# Patient Record
Sex: Female | Born: 1960
Health system: Southern US, Community
[De-identification: ages and names within clinical notes are randomized; demographics above are authoritative.]

## PROBLEM LIST (undated history)

## (undated) DIAGNOSIS — I10 Essential (primary) hypertension: Secondary | ICD-10-CM

## (undated) DIAGNOSIS — E079 Disorder of thyroid, unspecified: Secondary | ICD-10-CM

## (undated) DIAGNOSIS — E7800 Pure hypercholesterolemia, unspecified: Secondary | ICD-10-CM

## (undated) DIAGNOSIS — H409 Unspecified glaucoma: Secondary | ICD-10-CM

---

## 2007-08-16 ENCOUNTER — Observation Stay (HOSPITAL_COMMUNITY): Admission: EM | Admit: 2007-08-16 | Discharge: 2007-08-17 | Payer: Self-pay | Admitting: Emergency Medicine

## 2011-01-06 NOTE — Discharge Summary (Signed)
NAME:  Nicole Greer, Nicole Greer                    ACCOUNT NO.:  [id number]   MEDICAL RECORD NO.:  [id number]          PATIENT TYPE:  INP   LOCATION:  3730                         FACILITY:  [facility]   PHYSICIAN:  Mohan N. Sharyn Lull, M.D. DATE OF BIRTH:  February 12, 1961   DATE OF ADMISSION:  08/16/2007  DATE OF DISCHARGE:  08/17/2007                               DISCHARGE SUMMARY   ADMITTING DIAGNOSIS:  1. Chest pain rule out myocardial infarction.  2. Anxiety disorder.  3. Elevated blood pressure, rule out hypertension.  4. Positive family history of coronary artery disease.   FINAL DIAGNOSIS:  1. Status post chest pain, myocardial infarction ruled.  2. New onset hypertension.  3. Elevated blood sugar, rule out diabetes mellitus.  4. Hypercholesteremia.  5. Morbid obesity.  6. Anxiety disorder.   DISCHARGE MEDICATIONS:  1. Enteric-coated aspirin 81 mg 1 tablet daily.  2. Toprol XL 25 mg tablet daily.  3. Protonix 40 mg 1 tablet daily.  4. Nitrostat 0.4 mg sublingual use as directed.  5. Xanax 0.25 mg 1 tablet twice daily as needed.   DIET:  Low salt, low cholesterol.  The patient has been advised to avoid  sweets.   ACTIVITY:  As tolerated.   Fasting blood sugar and hemoglobin A1c in 2 weeks.   CONDITION AT DISCHARGE:  Stable.   BRIEF HISTORY:  Ms. Walpole is a 50 year old white female with past medical  history significant anxiety disorder, positive family history of  coronary artery disease.  She came to the ER via EMS complaining of  retrosternal chest tightness pressure, grade 4/10 radiating to the left  shoulder associated with palpitation while at work.  She states pain not  related to food, breathing or movement.  Denies any nausea, vomiting  diaphoresis.  Denies shortness of breath.  Denies history of exertional  chest pain.  The patient received two baby aspirin with relief of chest  pain.  Denies such episodes of chest pain in the past.  She states she  is under a lot of stress lately  at work. Denies any cough, fever or  chills.  Denies abdominal pain.   PAST MEDICAL HISTORY:  As above.   PAST SURGICAL HISTORY:  None.   ALLERGIES:  None.   MEDICATION:  Vitamins.   SOCIAL HISTORY:  She is single, lives with partner. No history of  smoking.  Drinks occasionally socially.  Works for [employer].   FAMILY HISTORY:  Father is alive, he has coronary artery disease.  He  had PTCA stenting to LAD in the past.  He is hypertensive.  He has  hypercholesteremia and paroxysmal A fib. Mother is alive in good health,  four brothers in good health.   PHYSICAL EXAMINATION:  She is alert and oriented x3 in no acute  distress.  Blood pressure was 151/88, pulse was 97 regular.  Conjunctivae was pink.  NECK:  Supple, no JVD, no bruit.  LUNGS:  Clear to auscultation without rhonchi or rales.  CARDIOVASCULAR:  S1, S2 was normal.  There was a soft systolic murmur.  There was no S3 gallop or S4 gallop.  ABDOMEN:  Soft, obese, bowel sounds were present, nontender.  EXTREMITIES:  There is no clubbing, cyanosis or edema.   LABORATORY DATA:  EKG showed normal sinus rhythm with no acute ischemic  changes.  Her two sets of CPK-MB and troponin I were normal.  Hemoglobin  was 14.8, hematocrit 43.6, white count of 9.1. Potassium was 4.2,  glucose was slightly elevated 117, BUN 8, creatinine 0.87.  CRP was 0.6.  Cholesterol was 198, LDL 117, HDL 63.   BRIEF HOSPITAL COURSE:  The patient was admitted to the telemetry unit,  MI was ruled out by serial enzymes and EKG. The patient did not have any  episodes of chest pain during the hospital stay. The patient underwent  stress Myoview today, exercised for 7.30 minutes on Bruce protocol,  complained of fatigue and shortness of breath, no chest pain was  reported. Peak blood pressure was 185/66, peak heart rate was 173 which  was 99% of maximum predicted heart rate. EKG showed no acute ischemic  changes during exercise or in  recovery phase. Occasional PVCs were noted  at the peak of exercise.  The patient's Myoview scan is still pending.  The patient will be discharged home this afternoon if her Myoview scan  is normal.      Mohan N. Sharyn Lull, M.D.  Electronically Signed     [facility]/MEDQ  D:  08/17/2007  T:  08/17/2007  Job:  [id number]

## 2011-05-29 LAB — COMPREHENSIVE METABOLIC PANEL
ALT: 26
AST: 27
Albumin: 4
CO2: 26
Calcium: 9.5
Chloride: 105
Potassium: 4.2

## 2011-05-29 LAB — CARDIAC PANEL(CRET KIN+CKTOT+MB+TROPI)
CK, MB: 0.9
Relative Index: INVALID
Total CK: 43

## 2011-05-29 LAB — CBC
HCT: 43.6
Hemoglobin: 14.8
MCV: 85.7
Platelets: 350
WBC: 9.1

## 2011-05-29 LAB — DIFFERENTIAL
Eosinophils Absolute: 0 — ABNORMAL LOW
Lymphocytes Relative: 18
Lymphs Abs: 1.6
Monocytes Relative: 5
Neutro Abs: 7

## 2011-05-29 LAB — LIPID PANEL
Cholesterol: 198
HDL: 63
VLDL: 18

## 2011-05-29 LAB — CK TOTAL AND CKMB (NOT AT ARMC)
CK, MB: 1.3
Total CK: 59

## 2011-05-29 LAB — PROTIME-INR: INR: 0.9

## 2011-05-29 LAB — APTT: aPTT: 30

## 2011-05-29 LAB — C-REACTIVE PROTEIN: CRP: 0.6 — ABNORMAL HIGH (ref <0.6)

## 2011-05-29 LAB — HEPARIN LEVEL (UNFRACTIONATED): Heparin Unfractionated: 0.92 — ABNORMAL HIGH

## 2013-08-18 HISTORY — PX: ABDOMINAL HYSTERECTOMY: SHX81

## 2016-03-31 DIAGNOSIS — E669 Obesity, unspecified: Secondary | ICD-10-CM | POA: Diagnosis not present

## 2016-03-31 DIAGNOSIS — I10 Essential (primary) hypertension: Secondary | ICD-10-CM | POA: Diagnosis not present

## 2016-03-31 DIAGNOSIS — E785 Hyperlipidemia, unspecified: Secondary | ICD-10-CM | POA: Diagnosis not present

## 2016-06-03 DIAGNOSIS — L0231 Cutaneous abscess of buttock: Secondary | ICD-10-CM | POA: Diagnosis not present

## 2016-06-08 DIAGNOSIS — L0231 Cutaneous abscess of buttock: Secondary | ICD-10-CM | POA: Diagnosis not present

## 2016-06-14 DIAGNOSIS — L0231 Cutaneous abscess of buttock: Secondary | ICD-10-CM | POA: Diagnosis not present

## 2016-06-29 DIAGNOSIS — C569 Malignant neoplasm of unspecified ovary: Secondary | ICD-10-CM | POA: Diagnosis not present

## 2016-06-29 DIAGNOSIS — Z6837 Body mass index (BMI) 37.0-37.9, adult: Secondary | ICD-10-CM | POA: Diagnosis not present

## 2016-06-29 DIAGNOSIS — D500 Iron deficiency anemia secondary to blood loss (chronic): Secondary | ICD-10-CM | POA: Diagnosis not present

## 2016-06-29 DIAGNOSIS — T451X5A Adverse effect of antineoplastic and immunosuppressive drugs, initial encounter: Secondary | ICD-10-CM | POA: Diagnosis not present

## 2016-06-29 DIAGNOSIS — C541 Malignant neoplasm of endometrium: Secondary | ICD-10-CM | POA: Diagnosis not present

## 2016-06-29 DIAGNOSIS — G620 Drug-induced polyneuropathy: Secondary | ICD-10-CM | POA: Diagnosis not present

## 2016-07-06 DIAGNOSIS — J8410 Pulmonary fibrosis, unspecified: Secondary | ICD-10-CM | POA: Diagnosis not present

## 2016-07-06 DIAGNOSIS — Z8542 Personal history of malignant neoplasm of other parts of uterus: Secondary | ICD-10-CM | POA: Diagnosis not present

## 2016-07-06 DIAGNOSIS — C541 Malignant neoplasm of endometrium: Secondary | ICD-10-CM | POA: Diagnosis not present

## 2016-07-06 DIAGNOSIS — Z8543 Personal history of malignant neoplasm of ovary: Secondary | ICD-10-CM | POA: Diagnosis not present

## 2016-07-06 DIAGNOSIS — M47814 Spondylosis without myelopathy or radiculopathy, thoracic region: Secondary | ICD-10-CM | POA: Diagnosis not present

## 2016-07-06 DIAGNOSIS — R911 Solitary pulmonary nodule: Secondary | ICD-10-CM | POA: Diagnosis not present

## 2016-07-06 DIAGNOSIS — L0231 Cutaneous abscess of buttock: Secondary | ICD-10-CM | POA: Diagnosis not present

## 2016-07-09 DIAGNOSIS — K611 Rectal abscess: Secondary | ICD-10-CM | POA: Diagnosis not present

## 2016-07-09 DIAGNOSIS — Z6836 Body mass index (BMI) 36.0-36.9, adult: Secondary | ICD-10-CM | POA: Diagnosis not present

## 2016-07-10 DIAGNOSIS — E785 Hyperlipidemia, unspecified: Secondary | ICD-10-CM | POA: Diagnosis not present

## 2016-07-10 DIAGNOSIS — Z01810 Encounter for preprocedural cardiovascular examination: Secondary | ICD-10-CM | POA: Diagnosis not present

## 2016-07-10 DIAGNOSIS — Z923 Personal history of irradiation: Secondary | ICD-10-CM | POA: Diagnosis not present

## 2016-07-10 DIAGNOSIS — Z9889 Other specified postprocedural states: Secondary | ICD-10-CM | POA: Diagnosis not present

## 2016-07-10 DIAGNOSIS — Z9221 Personal history of antineoplastic chemotherapy: Secondary | ICD-10-CM | POA: Diagnosis not present

## 2016-07-10 DIAGNOSIS — D649 Anemia, unspecified: Secondary | ICD-10-CM | POA: Diagnosis not present

## 2016-07-10 DIAGNOSIS — K644 Residual hemorrhoidal skin tags: Secondary | ICD-10-CM | POA: Diagnosis not present

## 2016-07-10 DIAGNOSIS — Z7982 Long term (current) use of aspirin: Secondary | ICD-10-CM | POA: Diagnosis not present

## 2016-07-10 DIAGNOSIS — K611 Rectal abscess: Secondary | ICD-10-CM | POA: Diagnosis not present

## 2016-07-10 DIAGNOSIS — Z8543 Personal history of malignant neoplasm of ovary: Secondary | ICD-10-CM | POA: Diagnosis not present

## 2016-07-10 DIAGNOSIS — Z90710 Acquired absence of both cervix and uterus: Secondary | ICD-10-CM | POA: Diagnosis not present

## 2016-07-10 DIAGNOSIS — Z79899 Other long term (current) drug therapy: Secondary | ICD-10-CM | POA: Diagnosis not present

## 2016-07-10 DIAGNOSIS — I10 Essential (primary) hypertension: Secondary | ICD-10-CM | POA: Diagnosis not present

## 2016-07-28 DIAGNOSIS — E669 Obesity, unspecified: Secondary | ICD-10-CM | POA: Diagnosis not present

## 2016-07-28 DIAGNOSIS — I10 Essential (primary) hypertension: Secondary | ICD-10-CM | POA: Diagnosis not present

## 2016-07-28 DIAGNOSIS — E785 Hyperlipidemia, unspecified: Secondary | ICD-10-CM | POA: Diagnosis not present

## 2016-09-01 DIAGNOSIS — K611 Rectal abscess: Secondary | ICD-10-CM | POA: Diagnosis not present

## 2016-09-01 DIAGNOSIS — Z09 Encounter for follow-up examination after completed treatment for conditions other than malignant neoplasm: Secondary | ICD-10-CM | POA: Diagnosis not present

## 2016-09-15 DIAGNOSIS — K611 Rectal abscess: Secondary | ICD-10-CM | POA: Diagnosis not present

## 2016-09-15 DIAGNOSIS — Z09 Encounter for follow-up examination after completed treatment for conditions other than malignant neoplasm: Secondary | ICD-10-CM | POA: Diagnosis not present

## 2016-10-06 DIAGNOSIS — Z6836 Body mass index (BMI) 36.0-36.9, adult: Secondary | ICD-10-CM | POA: Diagnosis not present

## 2016-10-06 DIAGNOSIS — K611 Rectal abscess: Secondary | ICD-10-CM | POA: Diagnosis not present

## 2016-10-20 DIAGNOSIS — K611 Rectal abscess: Secondary | ICD-10-CM | POA: Diagnosis not present

## 2016-11-03 DIAGNOSIS — Z6836 Body mass index (BMI) 36.0-36.9, adult: Secondary | ICD-10-CM | POA: Diagnosis not present

## 2016-11-03 DIAGNOSIS — K611 Rectal abscess: Secondary | ICD-10-CM | POA: Diagnosis not present

## 2016-11-03 DIAGNOSIS — Z9889 Other specified postprocedural states: Secondary | ICD-10-CM | POA: Diagnosis not present

## 2016-11-04 DIAGNOSIS — Z90710 Acquired absence of both cervix and uterus: Secondary | ICD-10-CM | POA: Diagnosis not present

## 2016-11-04 DIAGNOSIS — Z833 Family history of diabetes mellitus: Secondary | ICD-10-CM | POA: Diagnosis not present

## 2016-11-04 DIAGNOSIS — Z08 Encounter for follow-up examination after completed treatment for malignant neoplasm: Secondary | ICD-10-CM | POA: Diagnosis not present

## 2016-11-04 DIAGNOSIS — Z8543 Personal history of malignant neoplasm of ovary: Secondary | ICD-10-CM | POA: Diagnosis not present

## 2016-11-04 DIAGNOSIS — Z6836 Body mass index (BMI) 36.0-36.9, adult: Secondary | ICD-10-CM | POA: Diagnosis not present

## 2016-11-04 DIAGNOSIS — C541 Malignant neoplasm of endometrium: Secondary | ICD-10-CM | POA: Diagnosis not present

## 2016-11-04 DIAGNOSIS — Z801 Family history of malignant neoplasm of trachea, bronchus and lung: Secondary | ICD-10-CM | POA: Diagnosis not present

## 2016-11-04 DIAGNOSIS — G620 Drug-induced polyneuropathy: Secondary | ICD-10-CM | POA: Diagnosis not present

## 2016-11-04 DIAGNOSIS — I10 Essential (primary) hypertension: Secondary | ICD-10-CM | POA: Diagnosis not present

## 2016-11-04 DIAGNOSIS — C569 Malignant neoplasm of unspecified ovary: Secondary | ICD-10-CM | POA: Diagnosis not present

## 2016-11-04 DIAGNOSIS — T451X5S Adverse effect of antineoplastic and immunosuppressive drugs, sequela: Secondary | ICD-10-CM | POA: Diagnosis not present

## 2016-11-04 DIAGNOSIS — Z90722 Acquired absence of ovaries, bilateral: Secondary | ICD-10-CM | POA: Diagnosis not present

## 2016-11-04 DIAGNOSIS — D500 Iron deficiency anemia secondary to blood loss (chronic): Secondary | ICD-10-CM | POA: Diagnosis not present

## 2016-11-04 DIAGNOSIS — E7800 Pure hypercholesterolemia, unspecified: Secondary | ICD-10-CM | POA: Diagnosis not present

## 2016-11-04 DIAGNOSIS — T451X5A Adverse effect of antineoplastic and immunosuppressive drugs, initial encounter: Secondary | ICD-10-CM | POA: Diagnosis not present

## 2016-11-04 DIAGNOSIS — Z91048 Other nonmedicinal substance allergy status: Secondary | ICD-10-CM | POA: Diagnosis not present

## 2016-11-04 DIAGNOSIS — G43909 Migraine, unspecified, not intractable, without status migrainosus: Secondary | ICD-10-CM | POA: Diagnosis not present

## 2016-11-04 DIAGNOSIS — F329 Major depressive disorder, single episode, unspecified: Secondary | ICD-10-CM | POA: Diagnosis not present

## 2016-11-04 DIAGNOSIS — Z9221 Personal history of antineoplastic chemotherapy: Secondary | ICD-10-CM | POA: Diagnosis not present

## 2016-11-04 DIAGNOSIS — Z8249 Family history of ischemic heart disease and other diseases of the circulatory system: Secondary | ICD-10-CM | POA: Diagnosis not present

## 2016-11-04 DIAGNOSIS — Z7982 Long term (current) use of aspirin: Secondary | ICD-10-CM | POA: Diagnosis not present

## 2016-11-04 DIAGNOSIS — Z8542 Personal history of malignant neoplasm of other parts of uterus: Secondary | ICD-10-CM | POA: Diagnosis not present

## 2016-11-04 DIAGNOSIS — Z79899 Other long term (current) drug therapy: Secondary | ICD-10-CM | POA: Diagnosis not present

## 2016-11-04 DIAGNOSIS — Z803 Family history of malignant neoplasm of breast: Secondary | ICD-10-CM | POA: Diagnosis not present

## 2016-11-16 DIAGNOSIS — E785 Hyperlipidemia, unspecified: Secondary | ICD-10-CM | POA: Diagnosis not present

## 2016-11-16 DIAGNOSIS — I10 Essential (primary) hypertension: Secondary | ICD-10-CM | POA: Diagnosis not present

## 2016-12-01 DIAGNOSIS — K611 Rectal abscess: Secondary | ICD-10-CM | POA: Diagnosis not present

## 2016-12-01 DIAGNOSIS — Z6836 Body mass index (BMI) 36.0-36.9, adult: Secondary | ICD-10-CM | POA: Diagnosis not present

## 2016-12-01 DIAGNOSIS — Z9889 Other specified postprocedural states: Secondary | ICD-10-CM | POA: Diagnosis not present

## 2016-12-03 DIAGNOSIS — R002 Palpitations: Secondary | ICD-10-CM | POA: Diagnosis not present

## 2016-12-03 DIAGNOSIS — E785 Hyperlipidemia, unspecified: Secondary | ICD-10-CM | POA: Diagnosis not present

## 2016-12-03 DIAGNOSIS — I10 Essential (primary) hypertension: Secondary | ICD-10-CM | POA: Diagnosis not present

## 2016-12-08 DIAGNOSIS — C541 Malignant neoplasm of endometrium: Secondary | ICD-10-CM | POA: Diagnosis not present

## 2016-12-11 DIAGNOSIS — Z1231 Encounter for screening mammogram for malignant neoplasm of breast: Secondary | ICD-10-CM | POA: Diagnosis not present

## 2016-12-16 DIAGNOSIS — N6489 Other specified disorders of breast: Secondary | ICD-10-CM | POA: Diagnosis not present

## 2016-12-16 DIAGNOSIS — R928 Other abnormal and inconclusive findings on diagnostic imaging of breast: Secondary | ICD-10-CM | POA: Diagnosis not present

## 2016-12-31 DIAGNOSIS — Z6837 Body mass index (BMI) 37.0-37.9, adult: Secondary | ICD-10-CM | POA: Diagnosis not present

## 2016-12-31 DIAGNOSIS — K611 Rectal abscess: Secondary | ICD-10-CM | POA: Diagnosis not present

## 2016-12-31 DIAGNOSIS — Z9889 Other specified postprocedural states: Secondary | ICD-10-CM | POA: Diagnosis not present

## 2017-03-04 DIAGNOSIS — Z8543 Personal history of malignant neoplasm of ovary: Secondary | ICD-10-CM | POA: Diagnosis not present

## 2017-03-04 DIAGNOSIS — C569 Malignant neoplasm of unspecified ovary: Secondary | ICD-10-CM | POA: Diagnosis not present

## 2017-03-04 DIAGNOSIS — R935 Abnormal findings on diagnostic imaging of other abdominal regions, including retroperitoneum: Secondary | ICD-10-CM | POA: Diagnosis not present

## 2017-03-04 DIAGNOSIS — R971 Elevated cancer antigen 125 [CA 125]: Secondary | ICD-10-CM | POA: Diagnosis not present

## 2017-03-04 DIAGNOSIS — R600 Localized edema: Secondary | ICD-10-CM | POA: Diagnosis not present

## 2017-03-08 DIAGNOSIS — T451X5S Adverse effect of antineoplastic and immunosuppressive drugs, sequela: Secondary | ICD-10-CM | POA: Diagnosis not present

## 2017-03-08 DIAGNOSIS — G620 Drug-induced polyneuropathy: Secondary | ICD-10-CM | POA: Diagnosis not present

## 2017-03-08 DIAGNOSIS — I10 Essential (primary) hypertension: Secondary | ICD-10-CM | POA: Diagnosis not present

## 2017-03-08 DIAGNOSIS — T451X5A Adverse effect of antineoplastic and immunosuppressive drugs, initial encounter: Secondary | ICD-10-CM | POA: Diagnosis not present

## 2017-03-08 DIAGNOSIS — Z6836 Body mass index (BMI) 36.0-36.9, adult: Secondary | ICD-10-CM | POA: Diagnosis not present

## 2017-03-08 DIAGNOSIS — C541 Malignant neoplasm of endometrium: Secondary | ICD-10-CM | POA: Diagnosis not present

## 2017-03-08 DIAGNOSIS — G43909 Migraine, unspecified, not intractable, without status migrainosus: Secondary | ICD-10-CM | POA: Diagnosis not present

## 2017-03-08 DIAGNOSIS — D509 Iron deficiency anemia, unspecified: Secondary | ICD-10-CM | POA: Diagnosis not present

## 2017-03-08 DIAGNOSIS — C569 Malignant neoplasm of unspecified ovary: Secondary | ICD-10-CM | POA: Diagnosis not present

## 2017-03-08 DIAGNOSIS — E7800 Pure hypercholesterolemia, unspecified: Secondary | ICD-10-CM | POA: Diagnosis not present

## 2017-03-08 DIAGNOSIS — F329 Major depressive disorder, single episode, unspecified: Secondary | ICD-10-CM | POA: Diagnosis not present

## 2017-03-08 DIAGNOSIS — Z8542 Personal history of malignant neoplasm of other parts of uterus: Secondary | ICD-10-CM | POA: Diagnosis not present

## 2017-03-08 DIAGNOSIS — Z8543 Personal history of malignant neoplasm of ovary: Secondary | ICD-10-CM | POA: Diagnosis not present

## 2017-03-08 DIAGNOSIS — Z9221 Personal history of antineoplastic chemotherapy: Secondary | ICD-10-CM | POA: Diagnosis not present

## 2017-03-08 DIAGNOSIS — Z9049 Acquired absence of other specified parts of digestive tract: Secondary | ICD-10-CM | POA: Diagnosis not present

## 2017-03-08 DIAGNOSIS — Z08 Encounter for follow-up examination after completed treatment for malignant neoplasm: Secondary | ICD-10-CM | POA: Diagnosis not present

## 2017-03-24 DIAGNOSIS — K603 Anal fistula: Secondary | ICD-10-CM | POA: Diagnosis not present

## 2017-03-24 DIAGNOSIS — R198 Other specified symptoms and signs involving the digestive system and abdomen: Secondary | ICD-10-CM | POA: Diagnosis not present

## 2017-04-28 DIAGNOSIS — D120 Benign neoplasm of cecum: Secondary | ICD-10-CM | POA: Diagnosis not present

## 2017-04-28 DIAGNOSIS — K603 Anal fistula: Secondary | ICD-10-CM | POA: Diagnosis not present

## 2017-04-28 DIAGNOSIS — Z8542 Personal history of malignant neoplasm of other parts of uterus: Secondary | ICD-10-CM | POA: Diagnosis not present

## 2017-04-28 DIAGNOSIS — Z90710 Acquired absence of both cervix and uterus: Secondary | ICD-10-CM | POA: Diagnosis not present

## 2017-04-28 DIAGNOSIS — I10 Essential (primary) hypertension: Secondary | ICD-10-CM | POA: Diagnosis not present

## 2017-04-28 DIAGNOSIS — K627 Radiation proctitis: Secondary | ICD-10-CM | POA: Diagnosis not present

## 2017-04-28 DIAGNOSIS — K6289 Other specified diseases of anus and rectum: Secondary | ICD-10-CM | POA: Diagnosis not present

## 2017-04-28 DIAGNOSIS — Z923 Personal history of irradiation: Secondary | ICD-10-CM | POA: Diagnosis not present

## 2017-11-15 DIAGNOSIS — Z08 Encounter for follow-up examination after completed treatment for malignant neoplasm: Secondary | ICD-10-CM | POA: Diagnosis not present

## 2017-11-15 DIAGNOSIS — C541 Malignant neoplasm of endometrium: Secondary | ICD-10-CM | POA: Diagnosis not present

## 2017-11-15 DIAGNOSIS — C561 Malignant neoplasm of right ovary: Secondary | ICD-10-CM | POA: Diagnosis not present

## 2017-11-15 DIAGNOSIS — Z8543 Personal history of malignant neoplasm of ovary: Secondary | ICD-10-CM | POA: Diagnosis not present

## 2017-11-15 DIAGNOSIS — C562 Malignant neoplasm of left ovary: Secondary | ICD-10-CM | POA: Diagnosis not present

## 2017-11-15 DIAGNOSIS — Z90710 Acquired absence of both cervix and uterus: Secondary | ICD-10-CM | POA: Diagnosis not present

## 2017-11-15 DIAGNOSIS — Z8542 Personal history of malignant neoplasm of other parts of uterus: Secondary | ICD-10-CM | POA: Diagnosis not present

## 2017-11-29 DIAGNOSIS — E785 Hyperlipidemia, unspecified: Secondary | ICD-10-CM | POA: Diagnosis not present

## 2017-11-29 DIAGNOSIS — R7309 Other abnormal glucose: Secondary | ICD-10-CM | POA: Diagnosis not present

## 2017-11-29 DIAGNOSIS — I10 Essential (primary) hypertension: Secondary | ICD-10-CM | POA: Diagnosis not present

## 2017-11-29 DIAGNOSIS — Z8543 Personal history of malignant neoplasm of ovary: Secondary | ICD-10-CM | POA: Diagnosis not present

## 2017-11-30 DIAGNOSIS — R7303 Prediabetes: Secondary | ICD-10-CM | POA: Diagnosis not present

## 2017-11-30 DIAGNOSIS — E785 Hyperlipidemia, unspecified: Secondary | ICD-10-CM | POA: Diagnosis not present

## 2017-11-30 DIAGNOSIS — I10 Essential (primary) hypertension: Secondary | ICD-10-CM | POA: Diagnosis not present

## 2018-01-14 DIAGNOSIS — M9901 Segmental and somatic dysfunction of cervical region: Secondary | ICD-10-CM | POA: Diagnosis not present

## 2018-01-14 DIAGNOSIS — M50122 Cervical disc disorder at C5-C6 level with radiculopathy: Secondary | ICD-10-CM | POA: Diagnosis not present

## 2018-01-14 DIAGNOSIS — M9902 Segmental and somatic dysfunction of thoracic region: Secondary | ICD-10-CM | POA: Diagnosis not present

## 2018-01-14 DIAGNOSIS — M50123 Cervical disc disorder at C6-C7 level with radiculopathy: Secondary | ICD-10-CM | POA: Diagnosis not present

## 2018-01-21 DIAGNOSIS — M50123 Cervical disc disorder at C6-C7 level with radiculopathy: Secondary | ICD-10-CM | POA: Diagnosis not present

## 2018-01-21 DIAGNOSIS — M50122 Cervical disc disorder at C5-C6 level with radiculopathy: Secondary | ICD-10-CM | POA: Diagnosis not present

## 2018-01-21 DIAGNOSIS — M9902 Segmental and somatic dysfunction of thoracic region: Secondary | ICD-10-CM | POA: Diagnosis not present

## 2018-01-21 DIAGNOSIS — M9901 Segmental and somatic dysfunction of cervical region: Secondary | ICD-10-CM | POA: Diagnosis not present

## 2018-02-04 DIAGNOSIS — M50123 Cervical disc disorder at C6-C7 level with radiculopathy: Secondary | ICD-10-CM | POA: Diagnosis not present

## 2018-02-04 DIAGNOSIS — M50122 Cervical disc disorder at C5-C6 level with radiculopathy: Secondary | ICD-10-CM | POA: Diagnosis not present

## 2018-02-04 DIAGNOSIS — M9901 Segmental and somatic dysfunction of cervical region: Secondary | ICD-10-CM | POA: Diagnosis not present

## 2018-02-04 DIAGNOSIS — M9902 Segmental and somatic dysfunction of thoracic region: Secondary | ICD-10-CM | POA: Diagnosis not present

## 2018-02-28 DIAGNOSIS — E785 Hyperlipidemia, unspecified: Secondary | ICD-10-CM | POA: Diagnosis not present

## 2018-02-28 DIAGNOSIS — E669 Obesity, unspecified: Secondary | ICD-10-CM | POA: Diagnosis not present

## 2018-02-28 DIAGNOSIS — E039 Hypothyroidism, unspecified: Secondary | ICD-10-CM | POA: Diagnosis not present

## 2018-02-28 DIAGNOSIS — I10 Essential (primary) hypertension: Secondary | ICD-10-CM | POA: Diagnosis not present

## 2018-05-18 DIAGNOSIS — Z90710 Acquired absence of both cervix and uterus: Secondary | ICD-10-CM | POA: Diagnosis not present

## 2018-05-18 DIAGNOSIS — D751 Secondary polycythemia: Secondary | ICD-10-CM | POA: Diagnosis not present

## 2018-05-18 DIAGNOSIS — C549 Malignant neoplasm of corpus uteri, unspecified: Secondary | ICD-10-CM | POA: Diagnosis not present

## 2018-05-18 DIAGNOSIS — E7800 Pure hypercholesterolemia, unspecified: Secondary | ICD-10-CM | POA: Diagnosis not present

## 2018-05-18 DIAGNOSIS — Z90722 Acquired absence of ovaries, bilateral: Secondary | ICD-10-CM | POA: Diagnosis not present

## 2018-05-18 DIAGNOSIS — I10 Essential (primary) hypertension: Secondary | ICD-10-CM | POA: Diagnosis not present

## 2018-05-18 DIAGNOSIS — C569 Malignant neoplasm of unspecified ovary: Secondary | ICD-10-CM | POA: Diagnosis not present

## 2018-05-18 DIAGNOSIS — D509 Iron deficiency anemia, unspecified: Secondary | ICD-10-CM | POA: Diagnosis not present

## 2018-05-18 DIAGNOSIS — G620 Drug-induced polyneuropathy: Secondary | ICD-10-CM | POA: Diagnosis not present

## 2018-05-18 DIAGNOSIS — G43909 Migraine, unspecified, not intractable, without status migrainosus: Secondary | ICD-10-CM | POA: Diagnosis not present

## 2018-05-18 DIAGNOSIS — C561 Malignant neoplasm of right ovary: Secondary | ICD-10-CM | POA: Diagnosis not present

## 2018-05-18 DIAGNOSIS — T451X5S Adverse effect of antineoplastic and immunosuppressive drugs, sequela: Secondary | ICD-10-CM | POA: Diagnosis not present

## 2018-05-18 DIAGNOSIS — C562 Malignant neoplasm of left ovary: Secondary | ICD-10-CM | POA: Diagnosis not present

## 2018-05-18 DIAGNOSIS — F329 Major depressive disorder, single episode, unspecified: Secondary | ICD-10-CM | POA: Diagnosis not present

## 2018-05-25 DIAGNOSIS — E039 Hypothyroidism, unspecified: Secondary | ICD-10-CM | POA: Diagnosis not present

## 2018-05-30 DIAGNOSIS — I10 Essential (primary) hypertension: Secondary | ICD-10-CM | POA: Diagnosis not present

## 2018-05-30 DIAGNOSIS — E785 Hyperlipidemia, unspecified: Secondary | ICD-10-CM | POA: Diagnosis not present

## 2018-05-30 DIAGNOSIS — R7303 Prediabetes: Secondary | ICD-10-CM | POA: Diagnosis not present

## 2018-05-30 DIAGNOSIS — E039 Hypothyroidism, unspecified: Secondary | ICD-10-CM | POA: Diagnosis not present

## 2018-08-29 DIAGNOSIS — R7303 Prediabetes: Secondary | ICD-10-CM | POA: Diagnosis not present

## 2018-08-29 DIAGNOSIS — I10 Essential (primary) hypertension: Secondary | ICD-10-CM | POA: Diagnosis not present

## 2018-08-29 DIAGNOSIS — E039 Hypothyroidism, unspecified: Secondary | ICD-10-CM | POA: Diagnosis not present

## 2018-08-29 DIAGNOSIS — E785 Hyperlipidemia, unspecified: Secondary | ICD-10-CM | POA: Diagnosis not present

## 2018-08-30 DIAGNOSIS — E7800 Pure hypercholesterolemia, unspecified: Secondary | ICD-10-CM | POA: Diagnosis not present

## 2018-08-30 DIAGNOSIS — I10 Essential (primary) hypertension: Secondary | ICD-10-CM | POA: Diagnosis not present

## 2018-08-30 DIAGNOSIS — G620 Drug-induced polyneuropathy: Secondary | ICD-10-CM | POA: Diagnosis not present

## 2018-08-30 DIAGNOSIS — G43909 Migraine, unspecified, not intractable, without status migrainosus: Secondary | ICD-10-CM | POA: Diagnosis not present

## 2018-08-30 DIAGNOSIS — Z9221 Personal history of antineoplastic chemotherapy: Secondary | ICD-10-CM | POA: Diagnosis not present

## 2018-08-30 DIAGNOSIS — D751 Secondary polycythemia: Secondary | ICD-10-CM | POA: Diagnosis not present

## 2018-08-30 DIAGNOSIS — E611 Iron deficiency: Secondary | ICD-10-CM | POA: Diagnosis not present

## 2022-01-09 ENCOUNTER — Emergency Department (HOSPITAL_COMMUNITY): Payer: Medicare Other

## 2022-01-09 ENCOUNTER — Observation Stay (HOSPITAL_COMMUNITY)
Admission: EM | Admit: 2022-01-09 | Discharge: 2022-01-10 | Disposition: A | Discharge status: Home / Self Care | Payer: Medicare Other | Attending: Cardiology | Admitting: Cardiology

## 2022-01-09 ENCOUNTER — Other Ambulatory Visit: Payer: Self-pay

## 2022-01-09 ENCOUNTER — Encounter (HOSPITAL_COMMUNITY): Payer: Self-pay | Admitting: Emergency Medicine

## 2022-01-09 DIAGNOSIS — Z79899 Other long term (current) drug therapy: Secondary | ICD-10-CM | POA: Insufficient documentation

## 2022-01-09 DIAGNOSIS — E039 Hypothyroidism, unspecified: Secondary | ICD-10-CM | POA: Insufficient documentation

## 2022-01-09 DIAGNOSIS — Z8542 Personal history of malignant neoplasm of other parts of uterus: Secondary | ICD-10-CM | POA: Insufficient documentation

## 2022-01-09 DIAGNOSIS — R7303 Prediabetes: Secondary | ICD-10-CM | POA: Diagnosis not present

## 2022-01-09 DIAGNOSIS — R079 Chest pain, unspecified: Secondary | ICD-10-CM | POA: Insufficient documentation

## 2022-01-09 DIAGNOSIS — R55 Syncope and collapse: Secondary | ICD-10-CM

## 2022-01-09 DIAGNOSIS — I4892 Unspecified atrial flutter: Secondary | ICD-10-CM | POA: Diagnosis not present

## 2022-01-09 DIAGNOSIS — Z7901 Long term (current) use of anticoagulants: Secondary | ICD-10-CM | POA: Insufficient documentation

## 2022-01-09 DIAGNOSIS — I4891 Unspecified atrial fibrillation: Secondary | ICD-10-CM | POA: Insufficient documentation

## 2022-01-09 DIAGNOSIS — I10 Essential (primary) hypertension: Secondary | ICD-10-CM | POA: Insufficient documentation

## 2022-01-09 HISTORY — DX: Unspecified glaucoma: H40.9

## 2022-01-09 HISTORY — DX: Disorder of thyroid, unspecified: E07.9

## 2022-01-09 HISTORY — DX: Pure hypercholesterolemia, unspecified: E78.00

## 2022-01-09 HISTORY — DX: Essential (primary) hypertension: I10

## 2022-01-09 LAB — CBC
HCT: 47.1 % — ABNORMAL HIGH (ref 36.0–46.0)
Hemoglobin: 15.7 g/dL — ABNORMAL HIGH (ref 12.0–15.0)
MCH: 29.5 pg (ref 26.0–34.0)
MCHC: 33.3 g/dL (ref 30.0–36.0)
MCV: 88.4 fL (ref 80.0–100.0)
Platelets: 216 10*3/uL (ref 150–400)
RBC: 5.33 MIL/uL — ABNORMAL HIGH (ref 3.87–5.11)
RDW: 13.2 % (ref 11.5–15.5)
WBC: 9.7 10*3/uL (ref 4.0–10.5)
nRBC: 0 % (ref 0.0–0.2)

## 2022-01-09 LAB — COMPREHENSIVE METABOLIC PANEL
ALT: 33 U/L (ref 0–44)
AST: 34 U/L (ref 15–41)
Albumin: 3.3 g/dL — ABNORMAL LOW (ref 3.5–5.0)
Alkaline Phosphatase: 73 U/L (ref 38–126)
Anion gap: 10 (ref 5–15)
BUN: 12 mg/dL (ref 8–23)
CO2: 24 mmol/L (ref 22–32)
Calcium: 9.1 mg/dL (ref 8.9–10.3)
Chloride: 109 mmol/L (ref 98–111)
Creatinine, Ser: 1.12 mg/dL — ABNORMAL HIGH (ref 0.44–1.00)
GFR, Estimated: 56 mL/min — ABNORMAL LOW (ref >60)
Glucose, Bld: 132 mg/dL — ABNORMAL HIGH (ref 70–99)
Potassium: 4.3 mmol/L (ref 3.5–5.1)
Sodium: 143 mmol/L (ref 135–145)
Total Bilirubin: 0.5 mg/dL (ref 0.3–1.2)
Total Protein: 5.9 g/dL — ABNORMAL LOW (ref 6.5–8.1)

## 2022-01-09 LAB — TSH
TSH: 3.304 u[IU]/mL (ref 0.350–4.500)
TSH: 2.687 u[IU]/mL (ref 0.350–4.500)

## 2022-01-09 LAB — I-STAT CHEM 8, ED
BUN: 13 mg/dL (ref 8–23)
Calcium, Ion: 1.05 mmol/L — ABNORMAL LOW (ref 1.15–1.40)
Chloride: 107 mmol/L (ref 98–111)
Creatinine, Ser: 1 mg/dL (ref 0.44–1.00)
Glucose, Bld: 130 mg/dL — ABNORMAL HIGH (ref 70–99)
HCT: 46 % (ref 36.0–46.0)
Hemoglobin: 15.6 g/dL — ABNORMAL HIGH (ref 12.0–15.0)
Potassium: 4.2 mmol/L (ref 3.5–5.1)
Sodium: 141 mmol/L (ref 135–145)
TCO2: 23 mmol/L (ref 22–32)

## 2022-01-09 LAB — HIV ANTIBODY (ROUTINE TESTING W REFLEX): HIV Screen 4th Generation wRfx: NONREACTIVE

## 2022-01-09 LAB — MAGNESIUM
Magnesium: 1.7 mg/dL (ref 1.7–2.4)
Magnesium: 1.9 mg/dL (ref 1.7–2.4)

## 2022-01-09 LAB — HEMOGLOBIN A1C
Hgb A1c MFr Bld: 5.7 % — ABNORMAL HIGH (ref 4.8–5.6)
Mean Plasma Glucose: 116.89 mg/dL

## 2022-01-09 LAB — TROPONIN I (HIGH SENSITIVITY)
Troponin I (High Sensitivity): 4 ng/L (ref <18)
Troponin I (High Sensitivity): 4 ng/L (ref <18)
Troponin I (High Sensitivity): 3 ng/L (ref <18)

## 2022-01-09 LAB — PROTIME-INR
INR: 1 (ref 0.8–1.2)
Prothrombin Time: 13.2 seconds (ref 11.4–15.2)

## 2022-01-09 LAB — CBG MONITORING, ED: Glucose-Capillary: 120 mg/dL — ABNORMAL HIGH (ref 70–99)

## 2022-01-09 LAB — HEPARIN LEVEL (UNFRACTIONATED): Heparin Unfractionated: 0.53 IU/mL (ref 0.30–0.70)

## 2022-01-09 MED ORDER — ASPIRIN 300 MG RE SUPP
300.0000 mg | RECTAL | Status: AC
  Filled 2022-01-09: qty 1

## 2022-01-09 MED ORDER — AMIODARONE HCL IN DEXTROSE 360-4.14 MG/200ML-% IV SOLN
30.0000 mg/h | INTRAVENOUS | Status: DC
  Administered 2022-01-09: 30 mg/h via INTRAVENOUS
  Filled 2022-01-09: qty 200

## 2022-01-09 MED ORDER — ASPIRIN 81 MG PO TBEC
81.0000 mg | DELAYED_RELEASE_TABLET | Freq: Every day | ORAL | Status: DC
  Administered 2022-01-10: 81 mg via ORAL
  Filled 2022-01-09: qty 1

## 2022-01-09 MED ORDER — HEPARIN BOLUS VIA INFUSION
4500.0000 [IU] | Freq: Once | INTRAVENOUS | Status: AC
  Administered 2022-01-09: 4500 [IU] via INTRAVENOUS
  Filled 2022-01-09: qty 4500

## 2022-01-09 MED ORDER — DILTIAZEM HCL-DEXTROSE 125-5 MG/125ML-% IV SOLN (PREMIX)
5.0000 mg/h | INTRAVENOUS | Status: DC
  Administered 2022-01-09: 5 mg/h via INTRAVENOUS
  Filled 2022-01-09: qty 125

## 2022-01-09 MED ORDER — ACETAMINOPHEN 325 MG PO TABS: 650.0000 mg | ORAL_TABLET | ORAL | Status: DC | PRN

## 2022-01-09 MED ORDER — ATORVASTATIN CALCIUM 40 MG PO TABS
40.0000 mg | ORAL_TABLET | Freq: Every day | ORAL | Status: DC
  Administered 2022-01-09 – 2022-01-10 (×2): 40 mg via ORAL
  Filled 2022-01-09 (×2): qty 1

## 2022-01-09 MED ORDER — DULOXETINE HCL 20 MG PO CPEP
20.0000 mg | ORAL_CAPSULE | Freq: Every day | ORAL | Status: DC
  Administered 2022-01-09: 20 mg via ORAL
  Filled 2022-01-09: qty 1

## 2022-01-09 MED ORDER — AMIODARONE HCL IN DEXTROSE 360-4.14 MG/200ML-% IV SOLN
60.0000 mg/h | INTRAVENOUS | Status: DC
  Administered 2022-01-09 (×2): 60 mg/h via INTRAVENOUS
  Filled 2022-01-09: qty 200

## 2022-01-09 MED ORDER — ONDANSETRON HCL 4 MG/2ML IJ SOLN: 4.0000 mg | Freq: Four times a day (QID) | INTRAMUSCULAR | Status: DC | PRN

## 2022-01-09 MED ORDER — LATANOPROST 0.005 % OP SOLN
1.0000 [drp] | Freq: Every day | OPHTHALMIC | Status: DC
  Administered 2022-01-09: 1 [drp] via OPHTHALMIC
  Filled 2022-01-09: qty 2.5

## 2022-01-09 MED ORDER — ASPIRIN 81 MG PO CHEW
324.0000 mg | CHEWABLE_TABLET | ORAL | Status: AC
  Administered 2022-01-09: 324 mg via ORAL
  Filled 2022-01-09: qty 4

## 2022-01-09 MED ORDER — SODIUM CHLORIDE 0.9 % IV SOLN: INTRAVENOUS | Status: DC

## 2022-01-09 MED ORDER — METOPROLOL TARTRATE 12.5 MG HALF TABLET
12.5000 mg | ORAL_TABLET | Freq: Two times a day (BID) | ORAL | Status: DC
  Administered 2022-01-09 – 2022-01-10 (×2): 12.5 mg via ORAL
  Filled 2022-01-09 (×3): qty 1

## 2022-01-09 MED ORDER — NITROGLYCERIN 0.4 MG SL SUBL
0.4000 mg | SUBLINGUAL_TABLET | SUBLINGUAL | Status: DC | PRN
Start: 2022-01-09 — End: 2022-01-10

## 2022-01-09 MED ORDER — AMIODARONE LOAD VIA INFUSION
150.0000 mg | Freq: Once | INTRAVENOUS | Status: AC
  Administered 2022-01-09: 150 mg via INTRAVENOUS
  Filled 2022-01-09: qty 83.34

## 2022-01-09 MED ORDER — HEPARIN (PORCINE) 25000 UT/250ML-% IV SOLN
1200.0000 [IU]/h | INTRAVENOUS | Status: DC
  Administered 2022-01-09: 1200 [IU]/h via INTRAVENOUS
  Filled 2022-01-09: qty 250

## 2022-01-09 MED ORDER — GABAPENTIN 600 MG PO TABS
600.0000 mg | ORAL_TABLET | Freq: Three times a day (TID) | ORAL | Status: DC
  Administered 2022-01-09 – 2022-01-10 (×3): 600 mg via ORAL
  Filled 2022-01-09 (×3): qty 1

## 2022-01-09 NOTE — Progress Notes (Signed)
   01/09/22 1630  Assess: MEWS Score  Temp 98 F (36.7 C)  Assess: MEWS Score  MEWS Temp 0  MEWS Systolic 0  MEWS Pulse 2  MEWS RR 1  MEWS LOC 0  MEWS Score 3  MEWS Score Color Yellow  Assess: if the MEWS score is Yellow or Red  Were vital signs taken at a resting state? Yes  Focused Assessment No change from prior assessment  Early Detection of Sepsis Score *See Row Information* Low  MEWS guidelines implemented *See Row Information* Yes  Treat  MEWS Interventions Escalated (See documentation below)  Pain Scale 0-10  Pain Score 0  Take Vital Signs  Increase Vital Sign Frequency  Yellow: Q 2hr X 2 then Q 4hr X 2, if remains yellow, continue Q 4hrs  Escalate  MEWS: Escalate Yellow: discuss with charge nurse/RN and consider discussing with provider and RRT  Notify: Charge Nurse/RN  Name of Charge Nurse/RN Notified Yoko RN  Date Charge Nurse/RN Notified 01/09/22  Time Charge Nurse/RN Notified [place]  Document  Patient Outcome Stabilized after interventions  Progress note created (see row info) Yes

## 2022-01-09 NOTE — Progress Notes (Signed)
ANTICOAGULATION CONSULT NOTE - Initial Consult  Pharmacy Consult for Heparin Indication: atrial fibrillation  Not on File  Patient Measurements: Height: '5\' 6"'$  (167.6 cm) Weight: 113.4 kg (250 lb) IBW/kg (Calculated) : 59.3 Heparin Dosing Weight: 85.9 kg  Vital Signs: Temp: 97.7 F (36.5 C) (05/19 1245) BP: 102/84 (05/19 1315) Pulse Rate: 115 (05/19 1315)  Labs: Recent Labs    01/09/22 1244 01/09/22 1256  HGB 15.7* 15.6*  HCT 47.1* 46.0  PLT 216  --   LABPROT 13.2  --   INR 1.0  --   CREATININE 1.12* 1.00  TROPONINIHS 4  --     Estimated Creatinine Clearance: 75.5 mL/min (by C-G formula based on SCr of 1 mg/dL).   Medical History: Past Medical History:  Diagnosis Date   Glaucoma    High cholesterol    Hypertension    Thyroid disease     Medications:  Infusions:   diltiazem (CARDIZEM) infusion 5 mg/hr (01/09/22 1304)    Assessment: 61 yo F presenting with new onset atrial fibrillation, not on anticoagulation PTA. Baseline Hgb 15.7, plt 216. Pharmacy consulted for heparin dosing.   Goal of Therapy:  Heparin level 0.3-0.7 units/ml Monitor platelets by anticoagulation protocol: Yes   Plan:  Give heparin bolus 4500 units x 1, followed by infusion at 1200 units/hr. Check ~6 hr heparin level.  Daily CBC, heparin level. Monitor for signs/symptoms of bleeding.   Vance Peper, PharmD PGY1 Pharmacy Resident 01/09/2022 1:56 PM   Please check AMION for all [facility] phone numbers After 10:00 PM, call [place] [phone]

## 2022-01-09 NOTE — ED Provider Notes (Signed)
[facility] EMERGENCY DEPARTMENT Provider Note   CSN: [id number] Arrival date & time: 01/09/22  1232     History  Chief Complaint  Patient presents with   Loss of Consciousness    Nicole Greer is a 61 y.o. female.  61 year old female brought in by EMS from home with concern for new onset A-fib with RVR and syncope with chest pain.  Patient states that she was at home today, feeling poorly, fatigued.  Patient was getting work done around her house, laid down on the sofa while waiting to change laundry loads, stood up and felt lightheaded, passed out.  Patient woke up to her dog licking her face with tightness in the center of her chest and feeling diaphoretic.  She made it to the bedroom where she rested and called 911. Does report delta 8 gummy yesterday, not first time taking, unsure if related.       Home Medications Prior to Admission medications   Medication Sig Start Date End Date Taking? Authorizing Provider  atorvastatin (LIPITOR) 20 MG tablet Take 20 mg by mouth at bedtime. 10/10/21  Yes [provider]  DULoxetine (CYMBALTA) 20 MG capsule Take 20 mg by mouth at bedtime. 10/13/21  Yes [provider]  gabapentin (NEURONTIN) 600 MG tablet Take 600 mg by mouth 3 (three) times daily. 10/13/21  Yes [provider]  latanoprost (XALATAN) 0.005 % ophthalmic solution Place 1 drop into both eyes at bedtime.   Yes [provider]  levothyroxine (SYNTHROID) 25 MCG tablet Take 25 mcg by mouth daily before breakfast.   Yes [provider]  metoprolol succinate (TOPROL-XL) 50 MG 24 hr tablet Take 50 mg by mouth at bedtime.   Yes [provider]      Allergies    Patient has no allergy information on record.    Review of Systems   Review of Systems Negative except as per HPI Physical Exam Updated Vital Signs BP 105/75   Pulse 95   Temp 97.7 F (36.5 C)   Resp 13   Ht '5\' 6"'$  (1.676 m)   Wt 113.4 kg   SpO2 95%    BMI 40.35 kg/m  Physical Exam Vitals and nursing note reviewed.  Constitutional:      General: She is not in acute distress.    Appearance: She is well-developed. She is not diaphoretic.  HENT:     Head: Normocephalic and atraumatic.     Mouth/Throat:     Mouth: Mucous membranes are moist.  Eyes:     Extraocular Movements: Extraocular movements intact.     Pupils: Pupils are equal, round, and reactive to light.  Cardiovascular:     Rate and Rhythm: Tachycardia present. Rhythm irregular.     Pulses: Normal pulses.     Heart sounds: Normal heart sounds.  Pulmonary:     Effort: Pulmonary effort is normal.     Breath sounds: Normal breath sounds.  Abdominal:     Palpations: Abdomen is soft.     Tenderness: There is no abdominal tenderness.  Musculoskeletal:     Right lower leg: No edema.     Left lower leg: No edema.  Skin:    General: Skin is warm and dry.  Neurological:     Mental Status: She is alert and oriented to person, place, and time.     Sensory: No sensory deficit.     Motor: No weakness.  Psychiatric:        Behavior: Behavior  normal.    ED Results / Procedures / Treatments   Labs (all labs ordered are listed, but only abnormal results are displayed) Labs Reviewed  CBC - Abnormal; Notable for the following components:      Result Value   RBC 5.33 (*)    Hemoglobin 15.7 (*)    HCT 47.1 (*)    All other components within normal limits  COMPREHENSIVE METABOLIC PANEL - Abnormal; Notable for the following components:   Glucose, Bld 132 (*)    Creatinine, Ser 1.12 (*)    Total Protein 5.9 (*)    Albumin 3.3 (*)    GFR, Estimated 56 (*)    All other components within normal limits  CBG MONITORING, ED - Abnormal; Notable for the following components:   Glucose-Capillary 120 (*)    All other components within normal limits  I-STAT CHEM 8, ED - Abnormal; Notable for the following components:   Glucose, Bld 130 (*)    Calcium, Ion 1.05 (*)    Hemoglobin 15.6  (*)    All other components within normal limits  PROTIME-INR  MAGNESIUM  TSH  HEPARIN LEVEL (UNFRACTIONATED)  TROPONIN I (HIGH SENSITIVITY)  TROPONIN I (HIGH SENSITIVITY)    EKG EKG Interpretation  Date/Time:  Friday Jan 09 2022 12:45:01 EDT Ventricular Rate:  118 PR Interval:    QRS Duration: 129 QT Interval:  338 QTC Calculation: 488 R Axis:   50 Text Interpretation: Atrial flutter Ventricular premature complex Nonspecific intraventricular conduction delay Nonspecific T abnormalities, diffuse leads when compared to prior, now in atrial flutter. No STEMI Confirmed by Antony Blackbird [phone]) on 01/09/2022 12:46:33 PM  Radiology DG Chest Port 1 View  Result Date: 01/09/2022 CLINICAL DATA:  Syncopal episode this morning.  Chest pain. EXAM: PORTABLE CHEST 1 VIEW COMPARISON:  None Available. FINDINGS: The heart size and mediastinal contours are within normal limits. Both lungs are clear. The visualized skeletal structures are unremarkable. IMPRESSION: No active disease. Electronically Signed   By: Marlaine Hind M.D.   On: 01/09/2022 13:06    Procedures .Critical Care Performed by: Tacy Learn, PA-C Authorized by: Tacy Learn, PA-C   Critical care provider statement:    Critical care time (minutes):  30   Critical care was time spent personally by me on the following activities:  Development of treatment plan with patient or surrogate, discussions with consultants, evaluation of patient's response to treatment, examination of patient, ordering and review of laboratory studies, ordering and review of radiographic studies, ordering and performing treatments and interventions, pulse oximetry, re-evaluation of patient's condition and review of old charts    Medications Ordered in ED Medications  diltiazem (CARDIZEM) 125 mg in dextrose 5% 125 mL (1 mg/mL) infusion (7.5 mg/hr Intravenous Rate/Dose Change 01/09/22 1449)  heparin ADULT infusion 100 units/mL (25000 units/213m) (1,200  Units/hr Intravenous New Bag/Given 01/09/22 1445)  heparin bolus via infusion 4,500 Units (4,500 Units Intravenous Bolus from Bag 01/09/22 1445)    ED Course/ Medical Decision Making/ A&P       CHA2DS2-VASc Score: 2                    Medical Decision Making Amount and/or Complexity of Data Reviewed Labs: ordered. Radiology: ordered.  Risk Prescription drug management. Decision regarding hospitalization.   This patient presents to the ED for concern of feeling poorly, fatigue, syncope, chest tightness, this involves an extensive number of treatment options, and is a complaint that carries with it a high risk  of complications and morbidity.  The differential diagnosis includes a fib, ACS, dehydration, electrolyte disturbance    Co morbidities that complicate the patient evaluation  Hypertension, hyperlipidemia, thyroid disease   Additional history obtained:  Additional history obtained from EMS, found A-fib RVR with rate in the 150s, given 20 mg of diltiazem with rate improvement into the 70s External records from outside source obtained and reviewed including prior visit for follow-up ovarian cancer to her hematologist on Dec 30, 2021 surveillance monitoring, no active female   Lab Tests:  I Ordered, and personally interpreted labs.  The pertinent results include: Troponin is 4, CMP with creatinine of 1.12-no recent relevant for comparison, CBC with hemoglobin of 15.7, hematocrit 47.  Normal white blood cell count, normal platelets.  Magnesium normal, TSH normal.   Imaging Studies ordered:  I ordered imaging studies including chest x-ray I independently visualized and interpreted imaging which showed no acute disease I agree with the radiologist interpretation   Cardiac Monitoring: / EKG:  The patient was maintained on a cardiac monitor.  I personally viewed and interpreted the cardiac monitored which showed an underlying rhythm of: A-fib, rate fluctuating from  90-130s   Consultations Obtained:  I requested consultation with the cardiologist, Dr. Terrence Dupont,  and discussed lab and imaging findings as well as pertinent plan - they recommend: Admission, will come and see the patient to admit her.   Problem List / ED Course / Critical interventions / Medication management  61 year old female with chest tightness, feeling poorly today with syncopal episode, found by EMS to be in A-fib with RVR, no history of same previously.  Patient reportedly improved with diltiazem from 150s to 70s however at time of exam on arrival, found to have heart rate fluctuating from 90-130.  Diltiazem drip was ordered.  Chest x-ray unremarkable, remaining labs without significant change found.  Cardiology was consulted with plan for admission, heparin ordered per pharmacy. I ordered medication including diltiazem, heparin for A-fib RVR Reevaluation of the patient after these medicines showed that the patient stayed the same I have reviewed the patients home medicines and have made adjustments as needed   Social Determinants of Health:  Sees cardiology with Dr. Terrence Dupont   Test / Admission - Considered:  Admitted for new A-fib with RVR, syncope, chest tightness         Final Clinical Impression(s) / ED Diagnoses Final diagnoses:  New onset a-fib ([facility])  Atrial fibrillation with RVR ([facility])  Syncope, unspecified syncope type  Chest pain, unspecified type    Rx / DC Orders ED Discharge Orders          Ordered    Amb referral to AFIB Clinic        01/09/22 1252              Tacy Learn, PA-C 01/09/22 1625    Tegeler, Gwenyth Allegra, MD 01/10/22 [phone]

## 2022-01-09 NOTE — ED Triage Notes (Signed)
Pt BIB EMS c/o syncopal episode, presumed to be brought on by afib RVR. No hx afib. Initial BP 106/62, HR 150. '20mg'$  dilt given IV by EMS, HR now 70-140. Last BP 156/86 95% RA, RR 18. A/ox4. Pt denies CP/SOB.

## 2022-01-09 NOTE — H&P (Signed)
Kenneth Lax is an 61 y.o. female.   Chief Complaint: Retrosternal chest pressure associated with palpitation and syncopal episode HPI: Patient is 61 year old female with past medical history significant for hypertension, hyperlipidemia, morbid obesity, hypothyroidism, depression, prediabetic, history of endometrial and ovarian carcinoma in remission came to ER by EMS following syncopal episode.  Patient states she used delta A Gummies yesterday felt tired fatigued and weak this morning again felt very weak tired and chest pressure associated with palpitations and had syncopal episode at home patient denies any head trauma denies any seizure activity denies any weakness in the arms or legs.  Called EMS noted to be in A-fib flutter with RVR received 20 mg of IV Cardizem and was started on drip with control of heart rate and 120s now.  Patient denies any history of exertional chest pain pain was localized.  Denies PND orthopnea leg swelling.  EKG done in the ED showed atrial flutter with variable block first set of high-sensitivity troponin I is negative.  Past Medical History:  Diagnosis Date   Glaucoma    High cholesterol    Hypertension    Thyroid disease     Past Surgical History:  Procedure Laterality Date   ABDOMINAL HYSTERECTOMY  08/18/2013    History reviewed. No pertinent family history. Social History:  reports that she has never smoked. She has never used smokeless tobacco. She reports that she does not currently use alcohol. She reports that she does not use drugs.  Allergies: Not on File  (Not in a hospital admission)   Results for orders placed or performed during the hospital encounter of 01/09/22 (from the past 48 hour(s))  CBG monitoring, ED     Status: Abnormal   Collection Time: 01/09/22 12:43 PM  Result Value Ref Range   Glucose-Capillary 120 (H) 70 - 99 mg/dL    Comment: Glucose reference range applies only to samples taken after fasting for at least 8 hours.  CBC      Status: Abnormal   Collection Time: 01/09/22 12:44 PM  Result Value Ref Range   WBC 9.7 4.0 - 10.5 K/uL   RBC 5.33 (H) 3.87 - 5.11 MIL/uL   Hemoglobin 15.7 (H) 12.0 - 15.0 g/dL   HCT 47.1 (H) 36.0 - 46.0 %   MCV 88.4 80.0 - 100.0 fL   MCH 29.5 26.0 - 34.0 pg   MCHC 33.3 30.0 - 36.0 g/dL   RDW 13.2 11.5 - 15.5 %   Platelets 216 150 - 400 K/uL   nRBC 0.0 0.0 - 0.2 %    Comment: Performed at [facility], [place] [address], [place], [place] [address]  Troponin I (High Sensitivity)     Status: None   Collection Time: 01/09/22 12:44 PM  Result Value Ref Range   Troponin I (High Sensitivity) 4 <18 ng/L    Comment: (NOTE) Elevated high sensitivity troponin I (hsTnI) values and significant  changes across serial measurements may suggest ACS but many other  chronic and acute conditions are known to elevate hsTnI results.  Refer to the "Links" section for chest pain algorithms and additional  guidance. Performed at [facility], [place] [address] [place], [place] [address]   Protime-INR     Status: None   Collection Time: 01/09/22 12:44 PM  Result Value Ref Range   Prothrombin Time 13.2 11.4 - 15.2 seconds   INR 1.0 0.8 - 1.2    Comment: (NOTE) INR goal varies based on device and disease  states. Performed at [facility], [place] [address] [place], [place] [address]   Comprehensive metabolic panel     Status: Abnormal   Collection Time: 01/09/22 12:44 PM  Result Value Ref Range   Sodium 143 135 - 145 mmol/L   Potassium 4.3 3.5 - 5.1 mmol/L   Chloride 109 98 - 111 mmol/L   CO2 24 22 - 32 mmol/L   Glucose, Bld 132 (H) 70 - 99 mg/dL    Comment: Glucose reference range applies only to samples taken after fasting for at least 8 hours.   BUN 12 8 - 23 mg/dL   Creatinine, Ser 1.12 (H) 0.44 - 1.00 mg/dL   Calcium 9.1 8.9 - 10.3 mg/dL   Total Protein 5.9 (L) 6.5 - 8.1 g/dL   Albumin 3.3 (L) 3.5 - 5.0 g/dL   AST 34 15 - 41 U/L   ALT 33 0 - 44 U/L   Alkaline Phosphatase 73  38 - 126 U/L   Total Bilirubin 0.5 0.3 - 1.2 mg/dL   GFR, Estimated 56 (L) >60 mL/min    Comment: (NOTE) Calculated using the CKD-EPI Creatinine Equation (2021)    Anion gap 10 5 - 15    Comment: Performed at [facility], [place] [address], [place], [place] [address]  Magnesium     Status: None   Collection Time: 01/09/22 12:44 PM  Result Value Ref Range   Magnesium 1.7 1.7 - 2.4 mg/dL    Comment: Performed at [facility], [place] [address] [place], [place] [address]  TSH     Status: None   Collection Time: 01/09/22 12:44 PM  Result Value Ref Range   TSH 3.304 0.350 - 4.500 uIU/mL    Comment: Performed by a 3rd Generation assay with a functional sensitivity of <=0.01 uIU/mL. Performed at [facility], [place] [address] [place], [place] [address]   I-stat chem 8, ED (not at [facility] or [facility])     Status: Abnormal   Collection Time: 01/09/22 12:56 PM  Result Value Ref Range   Sodium 141 135 - 145 mmol/L   Potassium 4.2 3.5 - 5.1 mmol/L   Chloride 107 98 - 111 mmol/L   BUN 13 8 - 23 mg/dL   Creatinine, Ser 1.00 0.44 - 1.00 mg/dL   Glucose, Bld 130 (H) 70 - 99 mg/dL    Comment: Glucose reference range applies only to samples taken after fasting for at least 8 hours.   Calcium, Ion 1.05 (L) 1.15 - 1.40 mmol/L   TCO2 23 22 - 32 mmol/L   Hemoglobin 15.6 (H) 12.0 - 15.0 g/dL   HCT 46.0 36.0 - 46.0 %   DG Chest Port 1 View  Result Date: 01/09/2022 CLINICAL DATA:  Syncopal episode this morning.  Chest pain. EXAM: PORTABLE CHEST 1 VIEW COMPARISON:  None Available. FINDINGS: The heart size and mediastinal contours are within normal limits. Both lungs are clear. The visualized skeletal structures are unremarkable. IMPRESSION: No active disease. Electronically Signed   By: Marlaine Hind M.D.   On: 01/09/2022 13:06    Review of Systems  Constitutional:  Positive for fatigue.  HENT:  Negative for sore throat.   Eyes:  Negative for discharge and visual disturbance.  Respiratory:   Positive for chest tightness. Negative for shortness of breath.   Cardiovascular:  Positive for chest pain and palpitations. Negative for leg swelling.  Gastrointestinal:  Negative for abdominal distention and abdominal pain.  Genitourinary:  Negative  for difficulty urinating.  Neurological:  Positive for dizziness and syncope. Negative for seizures.   Blood pressure 108/81, pulse (!) 102, temperature 97.7 F (36.5 C), resp. rate (!) 24, height '5\' 6"'$  (1.676 m), weight 113.4 kg, SpO2 98 %. Physical Exam Constitutional:      Appearance: Normal appearance.  HENT:     Head: Normocephalic and atraumatic.  Eyes:     Extraocular Movements: Extraocular movements intact.     Conjunctiva/sclera: Conjunctivae normal.     Pupils: Pupils are equal, round, and reactive to light.  Cardiovascular:     Comments: Tachycardic irregularly irregular S1-S2 soft Pulmonary:     Effort: Pulmonary effort is normal.     Breath sounds: Normal breath sounds. No wheezing, rhonchi or rales.  Abdominal:     General: Abdomen is flat. Bowel sounds are normal. There is no distension.     Palpations: Abdomen is soft. There is no mass.  Musculoskeletal:        General: No swelling, tenderness or deformity.     Cervical back: Normal range of motion and neck supple.  Skin:    General: Skin is warm and dry.  Neurological:     General: No focal deficit present.     Mental Status: She is alert and oriented to person, place, and time.     Assessment/Plan New onset atrial flutter with RVR Status post syncope secondary to above Hypertension Hyperlipidemia Hypothyroidism Morbid obesity Prediabetic History of endometrial and ovarian carcinoma in the past in remission Plan As per orders Discussed with patient at length various options of treatment and agrees for medical management for now  Charolette Forward, MD 01/09/2022, 2:38 PM

## 2022-01-09 NOTE — Progress Notes (Signed)
ANTICOAGULATION CONSULT NOTE - Initial Consult  Pharmacy Consult for Heparin Indication: atrial fibrillation  Not on File  Patient Measurements: Height: '5\' 6"'$  (167.6 cm) Weight: 113.4 kg (250 lb) IBW/kg (Calculated) : 59.3 Heparin Dosing Weight: 85.9 kg  Vital Signs: Temp: 98.9 F (37.2 C) (05/19 2117) Temp Source: Oral (05/19 2117) BP: 100/77 (05/19 2117) Pulse Rate: 75 (05/19 2117)  Labs: Recent Labs    01/09/22 1244 01/09/22 1256 01/09/22 1445 01/09/22 2033  HGB 15.7* 15.6*  --   --   HCT 47.1* 46.0  --   --   PLT 216  --   --   --   LABPROT 13.2  --   --   --   INR 1.0  --   --   --   HEPARINUNFRC  --   --   --  0.53  CREATININE 1.12* 1.00  --   --   TROPONINIHS 4  --  4  --      Estimated Creatinine Clearance: 75.5 mL/min (by C-G formula based on SCr of 1 mg/dL).   Medical History: Past Medical History:  Diagnosis Date   Glaucoma    High cholesterol    Hypertension    Thyroid disease     Medications:  Infusions:   sodium chloride 50 mL/hr at 01/09/22 1719   amiodarone 60 mg/hr (01/09/22 2113)   Followed by   amiodarone     diltiazem (CARDIZEM) infusion 7.5 mg/hr (01/09/22 1449)   heparin 1,200 Units/hr (01/09/22 1445)    Assessment: 61 yo F presenting with new onset atrial fibrillation, not on anticoagulation PTA. Baseline Hgb 15.7, plt 216. Pharmacy consulted for heparin dosing.   Heparin level came back therapeutic this PM. Cont same rate and check level in AM  Goal of Therapy:  Heparin level 0.3-0.7 units/ml Monitor platelets by anticoagulation protocol: Yes   Plan:  Cont heparin 1200 units/hr. Daily CBC, heparin level. Monitor for signs/symptoms of bleeding.   Onnie Boer, PharmD, BCIDP, AAHIVP, CPP Infectious Disease Pharmacist 01/09/2022 9:27 PM

## 2022-01-10 ENCOUNTER — Observation Stay (HOSPITAL_COMMUNITY): Payer: Medicare Other

## 2022-01-10 DIAGNOSIS — Z8542 Personal history of malignant neoplasm of other parts of uterus: Secondary | ICD-10-CM | POA: Diagnosis not present

## 2022-01-10 DIAGNOSIS — I4892 Unspecified atrial flutter: Secondary | ICD-10-CM | POA: Diagnosis not present

## 2022-01-10 DIAGNOSIS — I10 Essential (primary) hypertension: Secondary | ICD-10-CM | POA: Diagnosis not present

## 2022-01-10 DIAGNOSIS — E039 Hypothyroidism, unspecified: Secondary | ICD-10-CM | POA: Diagnosis not present

## 2022-01-10 LAB — LIPID PANEL
Cholesterol: 106 mg/dL (ref 0–200)
HDL: 41 mg/dL (ref >40)
LDL Cholesterol: 37 mg/dL (ref 0–99)
Total CHOL/HDL Ratio: 2.6 RATIO
Triglycerides: 141 mg/dL (ref <150)
VLDL: 28 mg/dL (ref 0–40)

## 2022-01-10 LAB — BASIC METABOLIC PANEL
Anion gap: 9 (ref 5–15)
BUN: 11 mg/dL (ref 8–23)
CO2: 24 mmol/L (ref 22–32)
Calcium: 8.9 mg/dL (ref 8.9–10.3)
Chloride: 108 mmol/L (ref 98–111)
Creatinine, Ser: 1.01 mg/dL — ABNORMAL HIGH (ref 0.44–1.00)
GFR, Estimated: >60 mL/min (ref >60)
Glucose, Bld: 126 mg/dL — ABNORMAL HIGH (ref 70–99)
Potassium: 3.4 mmol/L — ABNORMAL LOW (ref 3.5–5.1)
Sodium: 141 mmol/L (ref 135–145)

## 2022-01-10 LAB — ECHOCARDIOGRAM COMPLETE
AR max vel: 2.87 cm2
AV Peak grad: 4.1 mmHg
Ao pk vel: 1.01 m/s
Area-P 1/2: 2.8 cm2
Height: 66 in
S' Lateral: 2.8 cm
Weight: 4000 oz

## 2022-01-10 MED ORDER — METOPROLOL SUCCINATE ER 25 MG PO TB24: 25.0000 mg | ORAL_TABLET | Freq: Every day | ORAL | 3 refills | Status: AC

## 2022-01-10 MED ORDER — AMIODARONE HCL 200 MG PO TABS: 200.0000 mg | ORAL_TABLET | Freq: Every day | ORAL | 1 refills | Status: AC

## 2022-01-10 MED ORDER — AMIODARONE HCL 200 MG PO TABS
200.0000 mg | ORAL_TABLET | Freq: Every day | ORAL | Status: DC
  Administered 2022-01-10: 200 mg via ORAL
  Filled 2022-01-10: qty 1

## 2022-01-10 MED ORDER — APIXABAN 5 MG PO TABS: 5.0000 mg | ORAL_TABLET | Freq: Two times a day (BID) | ORAL | 3 refills | Status: AC

## 2022-01-10 MED ORDER — APIXABAN 5 MG PO TABS
5.0000 mg | ORAL_TABLET | Freq: Two times a day (BID) | ORAL | Status: DC
  Administered 2022-01-10: 5 mg via ORAL
  Filled 2022-01-10: qty 1

## 2022-01-10 NOTE — Discharge Summary (Signed)
Discharge summary dictated on 01/10/2022 dictation number is [id number]

## 2022-01-10 NOTE — Care Management Obs Status (Signed)
[facility] NOTIFICATION   Patient Details  Name: Nicole Greer MRN: [id number] Date of Birth: 1961/03/04   Medicare Observation Status Notification Given:  Yes    Ladarian Bonczek G., RN 01/10/2022, 10:07 AM

## 2022-01-10 NOTE — Care Management Obs Status (Signed)
[facility] NOTIFICATION   Patient Details  Name: Nicole Greer MRN: [id number] Date of Birth: 13-Aug-1961   Medicare Observation Status Notification Given:  Yes    Jeffren Dombek G., RN 01/10/2022, 10:07 AM

## 2022-01-10 NOTE — Discharge Summary (Unsigned)
NAME: Nicole Greer, Nicole Greer MEDICAL RECORD NO: [id number] ACCOUNT NO: [id number] DATE OF BIRTH: [birth year] FACILITY: [facility] LOCATION: [facility] PHYSICIAN: Allegra Lai. Terrence Dupont, MD  Discharge Summary   ADMITTING DIAGNOSES:   1.  New-onset atrial flutter with rapid ventricular response. 2.  Status post syncope secondary to above. 3.  Hypertension. 4.  Hyperlipidemia. 5.  Hypothyroidism. 6.  Morbid obesity. 7.  Prediabetic. 8.  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 9.  Peripheral neuropathy.  DISCHARGE DIAGNOSES:   1.  Status post atrial flutter with RVR, converted back to normal sinus rhythm. 2.  Status post syncope. 3.  Hypertension. 4.  Hyperlipidemia. 5.  Hypothyroidism. 6.  Morbid obesity. 7.  Prediabetic. 8.  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 9.  Peripheral neuropathy.  DISCHARGE HOME MEDICATIONS:   1.  Amiodarone 200 mg 1 tablet daily. 2.  Eliquis 5 mg 1 tablet twice daily. 3.  Continue rest of her home medications except metoprolol, which has been reduced to metoprolol succinate 25 mg daily. 4.  Continue rest of the medications, i.e., atorvastatin 20 mg 1 tablet daily. 5.  Cymbalta 20 mg 1 tablet at bedtime. 6.  Gabapentin 600 mg 3 times daily. 7.  Levothyroxine 25 mcg 1 tablet daily. 8.  Xalatan 1 drop in both eyes at bedtime as before.  DIET:  Low salt, low cholesterol, 1800 calories ADA weight reducing diet.  The patient has been advised to refrain from using Delta 8 gummies or alcohol.  CONDITION AT DISCHARGE:  Stable.  FOLLOWUP:  Follow up with me in 1 week.  BRIEF HISTORY AND HOSPITAL COURSE:  The patient is a 60 year old female with past medical history significant for hypertension, hyperlipidemia, morbid obesity, hypothyroidism, depression, prediabetic, history of endometrial and ovarian carcinoma, in  remission.  She came to the ER by EMS following syncopal episode.  The patient states she used  Delta-8 gummies yesterday, felt tired, fatigued and weak. This morning again felt very weak, tired and had chest pressure associated with palpitation and had  syncopal episode at home.  The patient denies any head trauma.  Denies any seizure activity.  Denies any weakness in the arms or legs.  She called EMS, noted to be in AFib flutter with RVR.  The patient received 20 mg of IV Cardizem and was started on  drip with control of heart rate in 120s in atrial flutter.  The patient denies any history of exertional chest pain.  States chest pain was localized during palpitations.  The patient denies any PND, orthopnea, leg swelling.  EKG done in the ED showed  atrial flutter with variable block.  High sensitivity first set of troponin I was negative.  PHYSICAL EXAMINATION:   GENERAL: She was alert, awake, oriented x3. VITAL SIGNS:  Blood pressure was 108/81, pulse 102.  She was afebrile. HEENT:  Head normocephalic, atraumatic.  Eyes:  Extraocular movements intact.  Conjunctivae normal.  Pupils are equal, round, reactive. CARDIOVASCULAR:  Shows tachycardic, irregularly irregular rhythm.  S1, S2 was soft. LUNGS:  Clear to auscultation without rhonchi or rales. ABDOMEN:  Soft.  Bowel sounds present, nontender. EXTREMITIES:  There is no clubbing, cyanosis or edema. NEUROLOGIC:  Grossly intact.  LABORATORY DATA:  Sodium was 143, potassium 4.3, glucose was 132, BUN 12, creatinine 1.12.  Her mag was 1.7.  Repeat mag was 1.9.  Hemoglobin was 15.7, hematocrit 47.1, white count of 9.7.  Her cholesterol was 106, HDL was 41,  LDL 37, triglycerides 141.   Her TSH was 3.30.  HIV screen was nonreactive.  Chest x-ray showed no active disease.  EKG done in the ED showed atrial flutter with nonspecific intraventricular conduction delay and nonspecific T-wave abnormalities.  Repeat EKG done earlier this  morning showed normal sinus rhythm.  Atrial flutter resolved. 2D echo is still pending.  BRIEF HOSPITAL COURSE:  The  patient was admitted to cardiac telemetry unit.  MI was ruled out by serial enzymes and EKG.  The patient was started on IV amiodarone and subsequently was switched to p.o. amiodarone.  The patient last night converted back to  sinus rhythm and has remained in sinus rhythm.  The patient denies any further episodes of palpitations or syncopal episode.  The patient will be discharged home on above medications and after the echo is done and will be followed up in my office in 1  week.     PAA D: 01/10/2022 9:32:12 am T: 01/10/2022 10:42:00 am  JOB: [id number]/ [id number]

## 2022-03-30 DIAGNOSIS — I4892 Unspecified atrial flutter: Secondary | ICD-10-CM | POA: Diagnosis not present

## 2022-03-30 DIAGNOSIS — B354 Tinea corporis: Secondary | ICD-10-CM | POA: Diagnosis not present

## 2022-03-30 DIAGNOSIS — Z23 Encounter for immunization: Secondary | ICD-10-CM | POA: Diagnosis not present

## 2022-04-13 DIAGNOSIS — Z23 Encounter for immunization: Secondary | ICD-10-CM | POA: Diagnosis not present

## 2022-06-15 DIAGNOSIS — I10 Essential (primary) hypertension: Secondary | ICD-10-CM | POA: Diagnosis not present

## 2022-06-15 DIAGNOSIS — E039 Hypothyroidism, unspecified: Secondary | ICD-10-CM | POA: Diagnosis not present

## 2022-06-15 DIAGNOSIS — I4892 Unspecified atrial flutter: Secondary | ICD-10-CM | POA: Diagnosis not present

## 2022-06-15 DIAGNOSIS — E785 Hyperlipidemia, unspecified: Secondary | ICD-10-CM | POA: Diagnosis not present

## 2022-09-21 DIAGNOSIS — E785 Hyperlipidemia, unspecified: Secondary | ICD-10-CM | POA: Diagnosis not present

## 2022-09-21 DIAGNOSIS — E039 Hypothyroidism, unspecified: Secondary | ICD-10-CM | POA: Diagnosis not present

## 2022-09-21 DIAGNOSIS — I10 Essential (primary) hypertension: Secondary | ICD-10-CM | POA: Diagnosis not present

## 2022-09-21 DIAGNOSIS — I4892 Unspecified atrial flutter: Secondary | ICD-10-CM | POA: Diagnosis not present

## 2023-01-20 DIAGNOSIS — E782 Mixed hyperlipidemia: Secondary | ICD-10-CM | POA: Diagnosis not present

## 2023-01-20 DIAGNOSIS — I10 Essential (primary) hypertension: Secondary | ICD-10-CM | POA: Diagnosis not present

## 2023-01-20 DIAGNOSIS — E039 Hypothyroidism, unspecified: Secondary | ICD-10-CM | POA: Diagnosis not present

## 2023-01-20 DIAGNOSIS — Z8679 Personal history of other diseases of the circulatory system: Secondary | ICD-10-CM | POA: Diagnosis not present

## 2023-01-20 DIAGNOSIS — R7303 Prediabetes: Secondary | ICD-10-CM | POA: Diagnosis not present

## 2023-03-15 DIAGNOSIS — Z1231 Encounter for screening mammogram for malignant neoplasm of breast: Secondary | ICD-10-CM | POA: Diagnosis not present

## 2023-05-11 DIAGNOSIS — E785 Hyperlipidemia, unspecified: Secondary | ICD-10-CM | POA: Diagnosis not present

## 2023-05-11 DIAGNOSIS — I4892 Unspecified atrial flutter: Secondary | ICD-10-CM | POA: Diagnosis not present

## 2023-05-11 DIAGNOSIS — R7303 Prediabetes: Secondary | ICD-10-CM | POA: Diagnosis not present

## 2023-05-11 DIAGNOSIS — E039 Hypothyroidism, unspecified: Secondary | ICD-10-CM | POA: Diagnosis not present

## 2023-05-11 DIAGNOSIS — I10 Essential (primary) hypertension: Secondary | ICD-10-CM | POA: Diagnosis not present

## 2023-05-19 DIAGNOSIS — E782 Mixed hyperlipidemia: Secondary | ICD-10-CM | POA: Diagnosis not present

## 2023-05-19 DIAGNOSIS — Z8679 Personal history of other diseases of the circulatory system: Secondary | ICD-10-CM | POA: Diagnosis not present

## 2023-05-19 DIAGNOSIS — I10 Essential (primary) hypertension: Secondary | ICD-10-CM | POA: Diagnosis not present

## 2023-05-19 DIAGNOSIS — E039 Hypothyroidism, unspecified: Secondary | ICD-10-CM | POA: Diagnosis not present

## 2023-08-24 DIAGNOSIS — Z0000 Encounter for general adult medical examination without abnormal findings: Secondary | ICD-10-CM | POA: Diagnosis not present

## 2023-08-24 DIAGNOSIS — Z139 Encounter for screening, unspecified: Secondary | ICD-10-CM | POA: Diagnosis not present

## 2023-08-24 DIAGNOSIS — Z9181 History of falling: Secondary | ICD-10-CM | POA: Diagnosis not present

## 2023-09-20 DIAGNOSIS — R7303 Prediabetes: Secondary | ICD-10-CM | POA: Diagnosis not present

## 2023-09-20 DIAGNOSIS — E782 Mixed hyperlipidemia: Secondary | ICD-10-CM | POA: Diagnosis not present

## 2023-09-20 DIAGNOSIS — I10 Essential (primary) hypertension: Secondary | ICD-10-CM | POA: Diagnosis not present

## 2023-09-20 DIAGNOSIS — E039 Hypothyroidism, unspecified: Secondary | ICD-10-CM | POA: Diagnosis not present

## 2024-01-04 IMAGING — DX DG CHEST 1V PORT
1 series · 1 of 1 positions shown · non-contrast
Comparison: None Available.

CLINICAL DATA: Syncopal episode this morning.  Chest pain.

EXAM:
PORTABLE CHEST 1 VIEW

[chest ap]
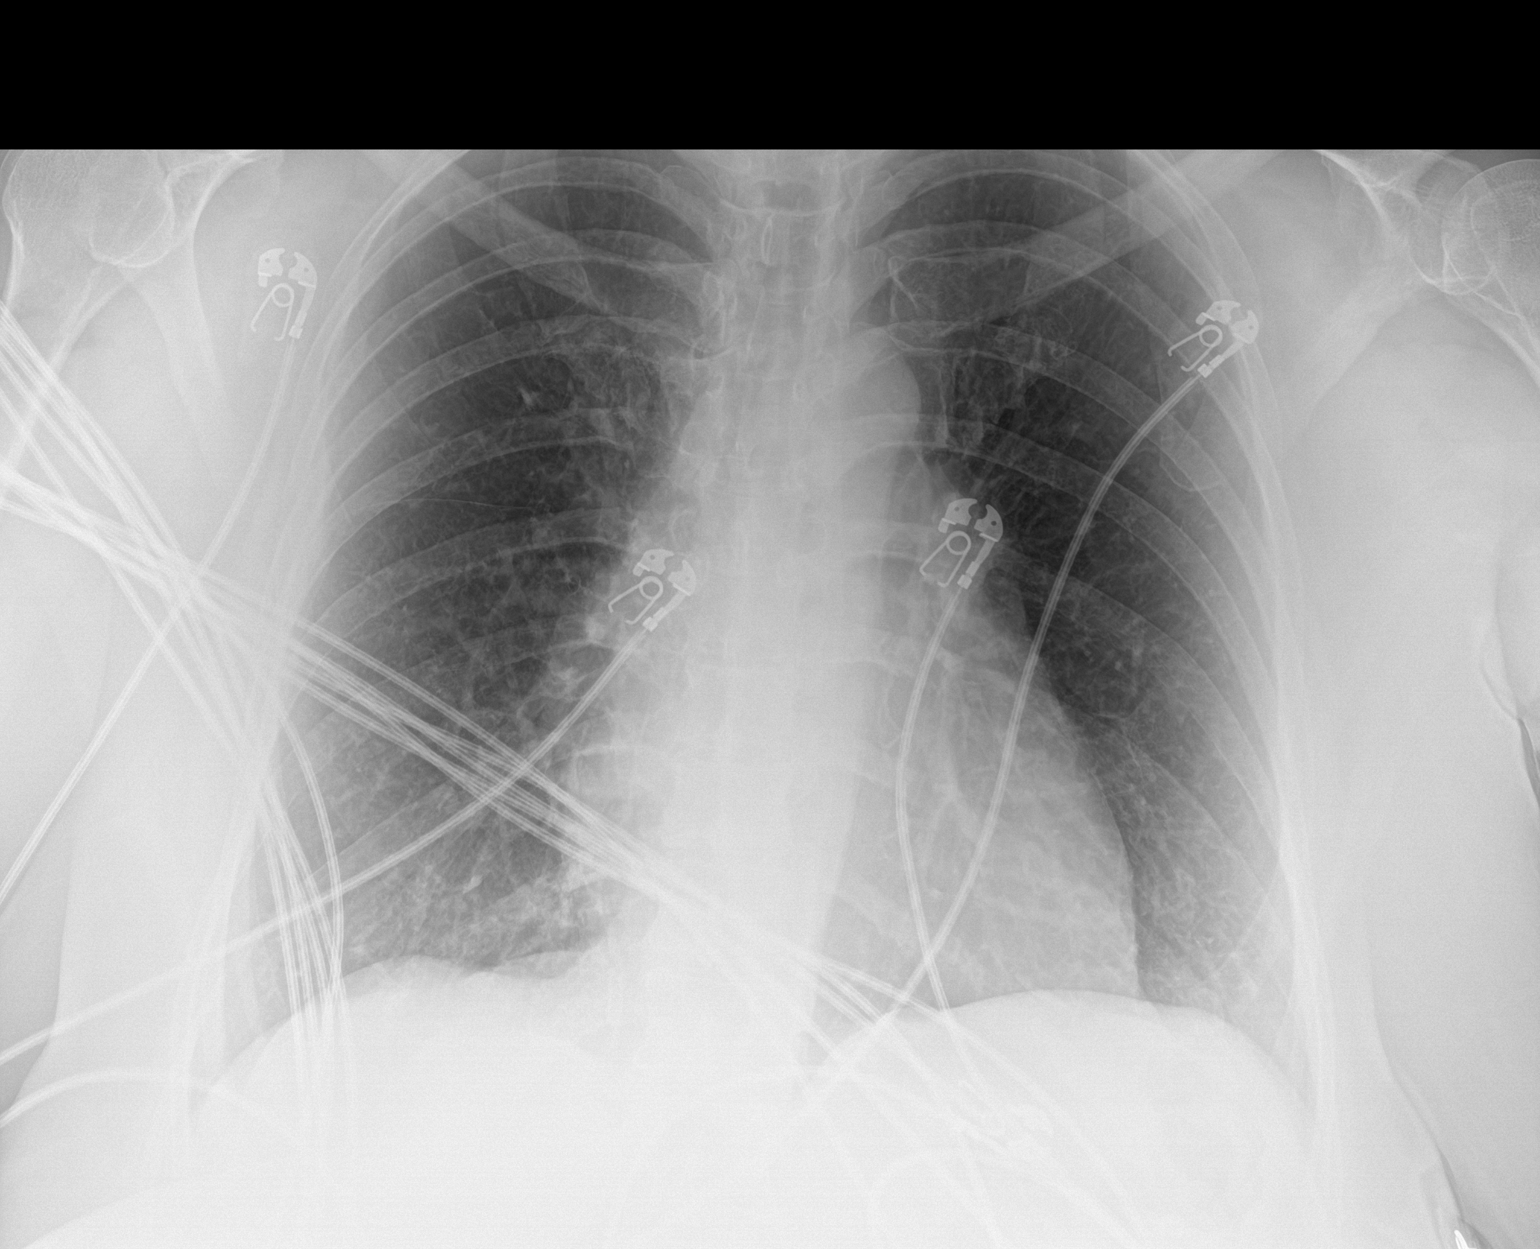

[1 of 1 positions shown; findings below may reference images not displayed]

FINDINGS: The heart size and mediastinal contours are within normal limits.
Both lungs are clear. The visualized skeletal structures are
unremarkable.
IMPRESSION: No active disease.

## 2024-01-10 DIAGNOSIS — E039 Hypothyroidism, unspecified: Secondary | ICD-10-CM | POA: Diagnosis not present

## 2024-01-10 DIAGNOSIS — I10 Essential (primary) hypertension: Secondary | ICD-10-CM | POA: Diagnosis not present

## 2024-01-10 DIAGNOSIS — E782 Mixed hyperlipidemia: Secondary | ICD-10-CM | POA: Diagnosis not present

## 2024-01-10 DIAGNOSIS — Z8679 Personal history of other diseases of the circulatory system: Secondary | ICD-10-CM | POA: Diagnosis not present

## 2024-03-15 DIAGNOSIS — Z1231 Encounter for screening mammogram for malignant neoplasm of breast: Secondary | ICD-10-CM | POA: Diagnosis not present

## 2024-05-08 DIAGNOSIS — E785 Hyperlipidemia, unspecified: Secondary | ICD-10-CM | POA: Diagnosis not present

## 2024-05-08 DIAGNOSIS — I10 Essential (primary) hypertension: Secondary | ICD-10-CM | POA: Diagnosis not present

## 2024-05-08 DIAGNOSIS — E039 Hypothyroidism, unspecified: Secondary | ICD-10-CM | POA: Diagnosis not present

## 2024-05-15 DIAGNOSIS — E782 Mixed hyperlipidemia: Secondary | ICD-10-CM | POA: Diagnosis not present

## 2024-05-15 DIAGNOSIS — E039 Hypothyroidism, unspecified: Secondary | ICD-10-CM | POA: Diagnosis not present

## 2024-05-15 DIAGNOSIS — I10 Essential (primary) hypertension: Secondary | ICD-10-CM | POA: Diagnosis not present

## 2024-05-15 DIAGNOSIS — Z8679 Personal history of other diseases of the circulatory system: Secondary | ICD-10-CM | POA: Diagnosis not present
